# Patient Record
Sex: Male | Born: 2005 | Race: Black or African American | Hispanic: No | Marital: Single | Smoking: Never smoker
Health system: Southern US, Community
[De-identification: ages and names within clinical notes are randomized; demographics above are authoritative.]

## PROBLEM LIST (undated history)

## (undated) DIAGNOSIS — T7840XA Allergy, unspecified, initial encounter: Secondary | ICD-10-CM

## (undated) DIAGNOSIS — Z8781 Personal history of (healed) traumatic fracture: Secondary | ICD-10-CM

## (undated) DIAGNOSIS — J45909 Unspecified asthma, uncomplicated: Secondary | ICD-10-CM

## (undated) HISTORY — DX: Personal history of (healed) traumatic fracture: Z87.81

## (undated) HISTORY — DX: Allergy, unspecified, initial encounter: T78.40XA

## (undated) HISTORY — DX: Unspecified asthma, uncomplicated: J45.909

## (undated) HISTORY — PX: ORBITAL FRACTURE SURGERY: SHX725

---
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
## 2023-06-12 ENCOUNTER — Encounter (HOSPITAL_COMMUNITY): Payer: Self-pay

## 2023-06-12 ENCOUNTER — Ambulatory Visit (INDEPENDENT_AMBULATORY_CARE_PROVIDER_SITE_OTHER)

## 2023-06-12 ENCOUNTER — Ambulatory Visit (HOSPITAL_COMMUNITY): Admission: EM | Admit: 2023-06-12 | Discharge: 2023-06-12 | Disposition: A | Discharge status: Home / Self Care

## 2023-06-12 DIAGNOSIS — J45901 Unspecified asthma with (acute) exacerbation: Secondary | ICD-10-CM | POA: Diagnosis not present

## 2023-06-12 DIAGNOSIS — J45909 Unspecified asthma, uncomplicated: Secondary | ICD-10-CM | POA: Diagnosis not present

## 2023-06-12 DIAGNOSIS — R0602 Shortness of breath: Secondary | ICD-10-CM | POA: Diagnosis not present

## 2023-06-12 MED ORDER — GUAIFENESIN ER 600 MG PO TB12: 600.0000 mg | ORAL_TABLET | Freq: Two times a day (BID) | ORAL | 0 refills | Status: AC

## 2023-06-12 MED ORDER — PREDNISONE 20 MG PO TABS: 40.0000 mg | ORAL_TABLET | Freq: Every day | ORAL | 0 refills | Status: AC

## 2023-06-12 MED ORDER — ALBUTEROL SULFATE HFA 108 (90 BASE) MCG/ACT IN AERS: 1.0000 | INHALATION_SPRAY | Freq: Four times a day (QID) | RESPIRATORY_TRACT | 0 refills | Status: DC | PRN

## 2023-06-12 MED ORDER — IPRATROPIUM-ALBUTEROL 0.5-2.5 (3) MG/3ML IN SOLN
3.0000 mL | Freq: Once | RESPIRATORY_TRACT | Status: AC
  Administered 2023-06-12: 3 mL via RESPIRATORY_TRACT

## 2023-06-12 MED ORDER — IPRATROPIUM-ALBUTEROL 0.5-2.5 (3) MG/3ML IN SOLN
RESPIRATORY_TRACT | Status: AC
  Filled 2023-06-12: qty 3

## 2023-06-12 NOTE — ED Provider Notes (Signed)
 [facility]    CSN: [id number] Arrival date & time: 06/12/23  4098      History   Chief Complaint Chief Complaint  Patient presents with   Asthma    HPI TIJUAN Chang is a 18 y.o. male.   Patient presents to clinic over concerns of shortness of breath and wheezing.  Noticed symptoms started about a week ago.  Does have a history of asthma, has not had any recent exacerbations.  Has been using albuterol  inhaler multiple times throughout the day with temporary relief.  Shortness of breath while resting that worsens with activity.  Woke from sleep around 3 times last night due to shortness of breath.  Last used albuterol  inhaler earlier this morning.  Has felt fatigued.  Denies fevers.  Endorses nonproductive cough, feels like there is mucus stuck in his chest.  Denies recent sick contacts.  Without vomiting or diarrhea.  The history is provided by the patient and medical records.  Asthma    Past Medical History:  Diagnosis Date   Allergy    Asthma    History of reduction of orbital fracture    History of wrist fracture     Patient Active Problem List   Diagnosis Date Noted   Bloating 03/07/2022   Mild intermittent asthma without complication 09/12/2021    Past Surgical History:  Procedure Laterality Date   ORBITAL FRACTURE SURGERY Left        Home Medications    Prior to Admission medications   Medication Sig Start Date End Date Taking? Authorizing Provider  albuterol  (VENTOLIN  HFA) 108 (90 Base) MCG/ACT inhaler Inhale 1-2 puffs into the lungs every 6 (six) hours as needed for wheezing or shortness of breath. 06/12/23  Yes Harlow Lighter, Nicholette Dolson  N, FNP  fexofenadine  (ALLEGRA ) 180 MG tablet Take 1 tablet (180 mg total) by mouth daily. 06/11/22  Yes McElwee, Lauren A, NP  Fluticasone Propionate, Inhal, (FLOVENT IN) Inhale into the lungs.   Yes [provider]  guaiFENesin (MUCINEX) 600 MG 12 hr tablet Take 1 tablet (600 mg total) by mouth 2  (two) times daily for 5 days. 06/12/23 06/17/23 Yes Yanni Quiroa  N, FNP  predniSONE  (DELTASONE ) 20 MG tablet Take 2 tablets (40 mg total) by mouth daily for 5 days. 06/12/23 06/17/23 Yes Harlow Lighter, Sebastain Fishbaugh  N, FNP    Family History Family History  Problem Relation Age of Onset   Healthy Mother    Healthy Father     Social History Social History   Tobacco Use   Smoking status: Never   Smokeless tobacco: Never  Vaping Use   Vaping status: Never Used  Substance Use Topics   Alcohol use: Yes    Comment: rarely   Drug use: Yes    Types: Marijuana    Comment: occasionally     Allergies   Peanut-containing drug products, Tree extract, and Other   Review of Systems Review of Systems  Per HPI  Physical Exam Triage Vital Signs ED Triage Vitals  Encounter Vitals Group     BP 06/12/23 1057 123/69     Systolic BP Percentile --      Diastolic BP Percentile --      Pulse Rate 06/12/23 1057 68     Resp 06/12/23 1057 18     Temp 06/12/23 1057 97.6 F (36.4 C)     Temp Source 06/12/23 1057 Oral     SpO2 06/12/23 1057 95 %     Weight --  Height --      Head Circumference --      Peak Flow --      Pain Score 06/12/23 1056 0     Pain Loc --      Pain Education --      Exclude from Growth Chart --    No data found.  Updated Vital Signs BP 123/69 (BP Location: Left Arm)   Pulse 68   Temp 97.6 F (36.4 C) (Oral)   Resp 18   SpO2 95%   Visual Acuity Right Eye Distance:   Left Eye Distance:   Bilateral Distance:    Right Eye Near:   Left Eye Near:    Bilateral Near:     Physical Exam Vitals and nursing note reviewed.  Constitutional:      Appearance: Normal appearance.  HENT:     Head: Normocephalic and atraumatic.     Right Ear: External ear normal.     Left Ear: External ear normal.     Nose: Nose normal.     Mouth/Throat:     Mouth: Mucous membranes are moist.  Eyes:     Conjunctiva/sclera: Conjunctivae normal.  Cardiovascular:     Rate and  Rhythm: Normal rate and regular rhythm.     Heart sounds: Normal heart sounds. No murmur heard. Pulmonary:     Effort: Pulmonary effort is normal.     Breath sounds: Wheezing and rhonchi present.  Skin:    General: Skin is warm and dry.  Neurological:     General: No focal deficit present.     Mental Status: He is alert.  Psychiatric:        Mood and Affect: Mood normal.      UC Treatments / Results  Labs (all labs ordered are listed, but only abnormal results are displayed) Labs Reviewed - No data to display  EKG   Radiology No results found.  Procedures Procedures (including critical care time)  Medications Ordered in UC Medications  ipratropium-albuterol  (DUONEB) 0.5-2.5 (3) MG/3ML nebulizer solution 3 mL (3 mLs Nebulization Given 06/12/23 1125)    Initial Impression / Assessment and Plan / UC Course  I have reviewed the triage vital signs and the nursing notes.  Pertinent labs & imaging results that were available during my care of the patient were reviewed by me and considered in my medical decision making (see chart for details).  Vitals in triage reviewed, patient is hemodynamically stable.  Heart with regular rate and rhythm, lungs with diffuse inspiratory and expiratory wheezing and rhonchi.  DuoNeb given in clinic.  Patient with overall better air movement after DuoNeb.  Chest x-ray by my interpretation does not show any acute infiltrate.  Will treat for asthma exacerbation with prednisone  burst.  Staff to schedule with PCP for further management of asthma.  Plan of care, follow-up care return precautions given, no questions at this time.  Work note provided.     Final Clinical Impressions(s) / UC Diagnoses   Final diagnoses:  Asthma with acute exacerbation, unspecified asthma severity, unspecified whether persistent     Discharge Instructions      Take the steroids today and then daily with breakfast.  Continue to use your albuterol  inhaler every 6  hours as needed for any wheezing or shortness of breath.  Mucinex will help loosen your secretions, ensure you are drinking at least 64 ounces of water daily.  Follow-up with your primary care provider regarding further management of your chronic disease, asthma.  Primary care providers are able to better manage chronic conditions such as asthma and can work with you regarding medication management.  Return to clinic for any new or urgent symptoms.   ED Prescriptions     Medication Sig Dispense Auth. Provider   predniSONE  (DELTASONE ) 20 MG tablet Take 2 tablets (40 mg total) by mouth daily for 5 days. 10 tablet Harlow Lighter, Kimetha Trulson  N, FNP   albuterol  (VENTOLIN  HFA) 108 (90 Base) MCG/ACT inhaler Inhale 1-2 puffs into the lungs every 6 (six) hours as needed for wheezing or shortness of breath. 18 g Harlow Lighter, Gracey Tolle  N, FNP   guaiFENesin (MUCINEX) 600 MG 12 hr tablet Take 1 tablet (600 mg total) by mouth 2 (two) times daily for 5 days. 10 tablet Harlow Lighter, Shoua Ulloa  N, FNP      PDMP not reviewed this encounter.   Chip Coulter, FNP 06/12/23 1149

## 2023-06-12 NOTE — Discharge Instructions (Signed)
 Take the steroids today and then daily with breakfast.  Continue to use your albuterol  inhaler every 6 hours as needed for any wheezing or shortness of breath.  Mucinex will help loosen your secretions, ensure you are drinking at least 64 ounces of water daily.  Follow-up with your primary care provider regarding further management of your chronic disease, asthma.  Primary care providers are able to better manage chronic conditions such as asthma and can work with you regarding medication management.  Return to clinic for any new or urgent symptoms.

## 2023-06-12 NOTE — ED Triage Notes (Signed)
 Patient presenting with SOB wheezing onset 1 week ago. Patient has asthma and having to use his inhaler more often. SOB with any exertion.  No known sick exposure.  Prescriptions or OTC medications tried: Yes- albuterol  inhaler   with mild relief.

## 2023-08-31 ENCOUNTER — Encounter: Payer: Self-pay | Admitting: Nurse Practitioner

## 2023-08-31 ENCOUNTER — Ambulatory Visit: Admitting: Nurse Practitioner

## 2023-08-31 VITALS — BP 106/59 | HR 69 | Temp 97.5°F | Wt 173.0 lb

## 2023-08-31 DIAGNOSIS — Z113 Encounter for screening for infections with a predominantly sexual mode of transmission: Secondary | ICD-10-CM | POA: Insufficient documentation

## 2023-08-31 DIAGNOSIS — Z1321 Encounter for screening for nutritional disorder: Secondary | ICD-10-CM

## 2023-08-31 DIAGNOSIS — Z0000 Encounter for general adult medical examination without abnormal findings: Secondary | ICD-10-CM | POA: Insufficient documentation

## 2023-08-31 DIAGNOSIS — J302 Other seasonal allergic rhinitis: Secondary | ICD-10-CM

## 2023-08-31 DIAGNOSIS — J309 Allergic rhinitis, unspecified: Secondary | ICD-10-CM | POA: Insufficient documentation

## 2023-08-31 DIAGNOSIS — Z1329 Encounter for screening for other suspected endocrine disorder: Secondary | ICD-10-CM

## 2023-08-31 DIAGNOSIS — F32A Depression, unspecified: Secondary | ICD-10-CM | POA: Diagnosis not present

## 2023-08-31 DIAGNOSIS — J4520 Mild intermittent asthma, uncomplicated: Secondary | ICD-10-CM

## 2023-08-31 DIAGNOSIS — Z13228 Encounter for screening for other metabolic disorders: Secondary | ICD-10-CM

## 2023-08-31 DIAGNOSIS — Z130 Encounter for screening for diseases of the blood and blood-forming organs and certain disorders involving the immune mechanism: Secondary | ICD-10-CM

## 2023-08-31 DIAGNOSIS — F419 Anxiety disorder, unspecified: Secondary | ICD-10-CM

## 2023-08-31 MED ORDER — ALBUTEROL SULFATE HFA 108 (90 BASE) MCG/ACT IN AERS: 1.0000 | INHALATION_SPRAY | Freq: Four times a day (QID) | RESPIRATORY_TRACT | 3 refills | Status: AC | PRN

## 2023-08-31 MED ORDER — FLUTICASONE PROPIONATE 50 MCG/ACT NA SUSP: 2.0000 | Freq: Every day | NASAL | 6 refills | Status: AC

## 2023-08-31 MED ORDER — FEXOFENADINE HCL 180 MG PO TABS: 180.0000 mg | ORAL_TABLET | Freq: Every day | ORAL | 3 refills | Status: AC

## 2023-08-31 NOTE — Patient Instructions (Signed)

## 2023-08-31 NOTE — Assessment & Plan Note (Addendum)
 Annual exam as documented.  Counseling done include healthy lifestyle involving committing to 150 minutes of exercise per week, heart healthy diet,  The importance of adequate sleep also discussed.  Regular use of seat bel also discussed . Changes in health habits are decided on by patient with goals and time frames set for achieving them. Immunization and cancer screening  needs are specifically addressed at this visit.  Routine fasting labs ordered Stated that he is up-to-date with his childhood vaccines

## 2023-08-31 NOTE — Assessment & Plan Note (Signed)
 Allegra  180 mg daily, Flonase  nasal spray 2 spray into both nostrils daily refilled

## 2023-08-31 NOTE — Assessment & Plan Note (Signed)
 Flowsheet Row Office Visit from 08/31/2023 in [facility] - A Dept Of [facility]  PHQ-9 Total Score 2       08/31/2023    9:48 AM 09/12/2021    4:12 PM  GAD 7 : Generalized Anxiety Score  Nervous, Anxious, on Edge 0 0  Control/stop worrying 3 0  Worry too much - different things 0 0  Trouble relaxing 0 0  Restless 0 0  Easily annoyed or irritable 0 0  Afraid - awful might happen 0 0  Total GAD 7 Score 3 0  Anxiety Difficulty Not difficult at all Not difficult at all  Patient declined Encouraged to call the office if his symptoms becomes worse Written  materials for anxiety and depression provided

## 2023-08-31 NOTE — Progress Notes (Signed)
 Complete physical exam  Patient: Blake Chang   DOB: January 26, 2005   18 y.o. Male  MRN: [id number]  Subjective:    Chief Complaint  Patient presents with   Establish Care      Discussed the use of AI scribe software for clinical note transcription with the patient, who gave verbal consent to proceed.  History of Present Illness Blake Chang is a 18 year old  has a past medical history of Allergy, Asthma, History of reduction of orbital fracture, and History of wrist fracture. male who presents for a new provider visit and annual physical exam.  He is establishing care with a new provider due to the inconvenience of his previous location in [place]. He seeks an annual physical examination.  He has a history of asthma, managed with an albuterol  inhaler as needed, primarily during certain seasons or when ill. He does not require daily use but experiences exacerbations necessitating increased use.  He has seasonal allergies, managed with Allegra  and a Flonase  (fluticasone ) inhaler. His allergies are not year-round.  His past surgical history includes an orbital fracture surgery. He denies any other medical conditions or surgeries.  He is sexually active with one partner, uses marijuana daily, and drinks alcohol socially. He does not smoke or vape. 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 He maintains an active lifestyle, engaging in running, roller skating, basketball, and regular workouts.  Family history is non-contributory with no known conditions such as cancer in his immediate family.  No vision problems. He feels generally well compared to peers and ensures adequate rest and sleep, averaging seven to eight hours nightly.   Screening was positive for anxiety and depression he attributes this to life in general, this has been ongoing for the past 6 weeks.  He denies SI, HI.  Not interested in treatment     Most  recent fall risk assessment:    09/08/2022   11:17 AM  Fall Risk   Falls in the past year? 0  Number falls in past yr: 0  Injury with Fall? 0  Risk for fall due to : No Fall Risks  Follow up Falls evaluation completed     Most recent depression screenings:    08/31/2023    9:46 AM 03/07/2022    9:10 AM  PHQ 2/9 Scores  PHQ - 2 Score 2 0  PHQ- 9 Score 2         Patient Care Team: Kenzleigh Sedam R, FNP as PCP - General (Nurse Practitioner)   Outpatient Medications Prior to Visit  Medication Sig   [DISCONTINUED] albuterol  (VENTOLIN  HFA) 108 (90 Base) MCG/ACT inhaler Inhale 1-2 puffs into the lungs every 6 (six) hours as needed for wheezing or shortness of breath.   [DISCONTINUED] fexofenadine  (ALLEGRA ) 180 MG tablet Take 1 tablet (180 mg total) by mouth daily.   [DISCONTINUED] Fluticasone  Propionate, Inhal, (FLOVENT  IN) Inhale into the lungs.   No facility-administered medications prior to visit.    Review of Systems  Constitutional:  Negative for appetite change, chills, fatigue and fever.  HENT:  Negative for congestion, postnasal drip, rhinorrhea and sneezing.   Eyes:  Negative for pain, discharge and itching.  Respiratory:  Negative for cough, shortness of breath and wheezing.   Cardiovascular:  Negative for chest pain, palpitations and leg swelling.  Gastrointestinal:  Negative for abdominal pain, constipation, nausea and vomiting.  Endocrine: Negative for cold intolerance, heat intolerance and polydipsia.  Genitourinary:  Negative for difficulty urinating, dysuria, flank pain and frequency.  Musculoskeletal:  Negative for arthralgias, back pain, joint swelling and myalgias.  Skin:  Negative for color change, pallor, rash and wound.  Neurological:  Negative for dizziness, facial asymmetry, weakness, numbness and headaches.  Psychiatric/Behavioral:  Negative for behavioral problems, confusion, self-injury and suicidal ideas.        Objective:     BP (!)  106/59   Pulse 69   Temp (!) 97.5 F (36.4 C)   Wt 173 lb (78.5 kg)   SpO2 99%   BMI 25.09 kg/m    Physical Exam Vitals and nursing note reviewed.  Constitutional:      General: He is not in acute distress.    Appearance: Normal appearance. He is not ill-appearing, toxic-appearing or diaphoretic.  HENT:     Right Ear: Tympanic membrane, ear canal and external ear normal. There is no impacted cerumen.     Left Ear: Tympanic membrane, ear canal and external ear normal. There is no impacted cerumen.     Nose: Nose normal. No congestion or rhinorrhea.     Mouth/Throat:     Mouth: Mucous membranes are moist.     Pharynx: Oropharynx is clear. No oropharyngeal exudate or posterior oropharyngeal erythema.  Eyes:     General: No scleral icterus.       Right eye: No discharge.        Left eye: No discharge.     Extraocular Movements: Extraocular movements intact.     Conjunctiva/sclera: Conjunctivae normal.  Neck:     Vascular: No carotid bruit.  Cardiovascular:     Rate and Rhythm: Normal rate and regular rhythm.     Pulses: Normal pulses.     Heart sounds: Normal heart sounds. No murmur heard.    No friction rub. No gallop.  Pulmonary:     Effort: Pulmonary effort is normal. No respiratory distress.     Breath sounds: Normal breath sounds. No stridor. No wheezing, rhonchi or rales.  Chest:     Chest wall: No tenderness.  Abdominal:     General: Bowel sounds are normal. There is no distension.     Palpations: Abdomen is soft. There is no mass.     Tenderness: There is no abdominal tenderness. There is no right CVA tenderness, left CVA tenderness, guarding or rebound.     Hernia: No hernia is present.  Musculoskeletal:        General: No swelling, tenderness, deformity or signs of injury.     Cervical back: Normal range of motion and neck supple. No rigidity or tenderness.     Right lower leg: No edema.     Left lower leg: No edema.  Lymphadenopathy:     Cervical: No cervical  adenopathy.  Skin:    General: Skin is warm and dry.     Capillary Refill: Capillary refill takes less than 2 seconds.     Coloration: Skin is not jaundiced or pale.     Findings: No bruising, erythema, lesion or rash.  Neurological:     Mental Status: He is alert and oriented to person, place, and time.     Cranial Nerves: No cranial nerve deficit.     Motor: No weakness.     Gait: Gait normal.  Psychiatric:        Mood and Affect: Mood normal.        Behavior: Behavior normal.        Thought Content:  Thought content normal.        Judgment: Judgment normal.     No results found for any visits on 08/31/23.     Assessment & Plan:    Routine Health Maintenance and Physical Exam  Immunization History  Administered Date(s) Administered   DTaP 06/13/2003, 08/22/2003, 10/18/2003, 12/16/2004, 08/02/2007   HIB (PRP-OMP) 06/13/2003, 08/22/2003, 10/18/2003, 06/03/2004   HPV 9-valent 11/25/2018, 02/03/2019, 05/23/2020   Hepatitis A 04/16/2005, 01/28/2006   Hepatitis B 2005-05[birth year], 05/18/2003, 10/18/2003   IPV 06/13/2003, 08/22/2003, 01/28/2004, 08/02/2007   MMR 06/03/2004, 08/02/2007   MenQuadfi_Meningococcal Groups ACYW Conjugate 11/20/2015, 09/14/2020   PPD Test 06/05/2021   Pneumococcal Conjugate-13 06/13/2003, 08/22/2003, 10/18/2003, 06/03/2004   Td 11/20/2015   Tdap 11/20/2015   Varicella 06/03/2004, 08/02/2007    Health Maintenance  Topic Date Due   Pneumococcal Vaccine: 19-49 Years (1 of 1 - PPSV23, PCV20, or PCV21) 04/11/2009   Meningococcal B Vaccine (1 of 2 - Standard) Never done   COVID-19 Vaccine (1 - 2024-25 season) Never done   INFLUENZA VACCINE  08/21/2023   DTaP/Tdap/Td (8 - Td or Tdap) 11/19/2025   Hepatitis B Vaccines  Completed   HPV VACCINES  Completed   Hepatitis C Screening  Completed   HIV Screening  Completed    Discussed health benefits of physical activity, and encouraged him to engage in regular exercise appropriate for his age and  condition.  Problem List Items Addressed This Visit       Respiratory   Mild intermittent asthma without complication   Assessment and Plan Assessment & Plan  Continue albuterol  inhaler as needed Medication refilled      Relevant Medications   albuterol  (VENTOLIN  HFA) 108 (90 Base) MCG/ACT inhaler   Allergic rhinitis   Allegra  180 mg daily, Flonase  nasal spray 2 spray into both nostrils daily refilled       Relevant Medications   fexofenadine  (ALLEGRA ) 180 MG tablet   fluticasone  (FLONASE ) 50 MCG/ACT nasal spray     Other   Annual physical exam - Primary   Annual exam as documented.  Counseling done include healthy lifestyle involving committing to 150 minutes of exercise per week, heart healthy diet,  The importance of adequate sleep also discussed.  Regular use of seat bel also discussed . Changes in health habits are decided on by patient with goals and time frames set for achieving them. Immunization and cancer screening  needs are specifically addressed at this visit.  Routine fasting labs ordered Stated that he is up-to-date with his childhood vaccines      Screen for STD (sexually transmitted disease)   Need to maintain 1 sexual partner and use condom to reduce risk of STDs discussed      Relevant Orders   RPR   HepB+HepC+HIV Panel   Chlamydia/Gonococcus/Trichomonas, NAA   Anxiety and depression   Flowsheet Row Office Visit from 08/31/2023 in [facility] - [facility] Dept Of [facility]  PHQ-9 Total Score 2       08/31/2023    9:48 AM 09/12/2021    4:12 PM  GAD 7 : Generalized Anxiety Score  Nervous, Anxious, on Edge 0 0  Control/stop worrying 3 0  Worry too much - different things 0 0  Trouble relaxing 0 0  Restless 0 0  Easily annoyed or irritable 0 0  Afraid - awful might happen 0 0  Total GAD 7 Score 3 0  Anxiety Difficulty Not difficult at all Not difficult  at all  Patient declined Encouraged to call the office if his  symptoms becomes worse Written  materials for anxiety and depression provided        Other Visit Diagnoses       Screening for endocrine, nutritional, metabolic and immunity disorder       Relevant Orders   Lipid panel   CBC   CMP14+EGFR      Return in about 1 year (around 08/30/2024) for FASTING LABS THIS WEEK.     Angeleigh Chiasson R Verleen Stuckey, FNP

## 2023-08-31 NOTE — Assessment & Plan Note (Signed)
 Need to maintain 1 sexual partner and use condom to reduce risk of STDs discussed

## 2023-08-31 NOTE — Assessment & Plan Note (Signed)
 Assessment and Plan Assessment & Plan  Continue albuterol  inhaler as needed Medication refilled

## 2023-09-01 ENCOUNTER — Telehealth: Payer: Self-pay

## 2023-09-01 ENCOUNTER — Other Ambulatory Visit: Payer: Self-pay

## 2023-09-01 NOTE — Telephone Encounter (Signed)
 Pharmacy Patient Advocate Encounter  Received notification from [facility] that Prior Authorization for FEXOFENADINE  has been APPROVED from 09/01/2023 to 08/31/2024   PA #/Case ID/Reference #: [id number]

## 2023-09-03 ENCOUNTER — Other Ambulatory Visit: Payer: Self-pay

## 2023-09-07 ENCOUNTER — Telehealth: Payer: Self-pay | Admitting: Nurse Practitioner

## 2023-09-08 ENCOUNTER — Other Ambulatory Visit: Payer: Self-pay

## 2023-09-14 ENCOUNTER — Other Ambulatory Visit: Payer: Self-pay

## 2023-09-14 DIAGNOSIS — Z1321 Encounter for screening for nutritional disorder: Secondary | ICD-10-CM

## 2023-09-14 DIAGNOSIS — Z13228 Encounter for screening for other metabolic disorders: Secondary | ICD-10-CM | POA: Diagnosis not present

## 2023-09-14 DIAGNOSIS — Z113 Encounter for screening for infections with a predominantly sexual mode of transmission: Secondary | ICD-10-CM | POA: Diagnosis not present

## 2023-09-14 DIAGNOSIS — Z1329 Encounter for screening for other suspected endocrine disorder: Secondary | ICD-10-CM | POA: Diagnosis not present

## 2023-09-14 DIAGNOSIS — Z130 Encounter for screening for diseases of the blood and blood-forming organs and certain disorders involving the immune mechanism: Secondary | ICD-10-CM | POA: Diagnosis not present

## 2023-09-15 ENCOUNTER — Ambulatory Visit: Payer: Self-pay | Admitting: Nurse Practitioner

## 2023-09-15 LAB — CMP14+EGFR
ALT: 11 IU/L (ref 0–44)
AST: 15 IU/L (ref 0–40)
Albumin: 4.6 g/dL (ref 4.3–5.2)
Alkaline Phosphatase: 64 IU/L (ref 51–125)
BUN/Creatinine Ratio: 20 (ref 9–20)
BUN: 24 mg/dL — ABNORMAL HIGH (ref 6–20)
Bilirubin Total: 0.6 mg/dL (ref 0.0–1.2)
CO2: 23 mmol/L (ref 20–29)
Calcium: 9.4 mg/dL (ref 8.7–10.2)
Chloride: 104 mmol/L (ref 96–106)
Creatinine, Ser: 1.23 mg/dL (ref 0.76–1.27)
Globulin, Total: 2.3 g/dL (ref 1.5–4.5)
Glucose: 84 mg/dL (ref 70–99)
Potassium: 4.2 mmol/L (ref 3.5–5.2)
Sodium: 141 mmol/L (ref 134–144)
Total Protein: 6.9 g/dL (ref 6.0–8.5)
eGFR: 86 mL/min/1.73 (ref >59)

## 2023-09-15 LAB — LIPID PANEL
Chol/HDL Ratio: 2 ratio (ref 0.0–5.0)
Cholesterol, Total: 146 mg/dL (ref 100–199)
HDL: 74 mg/dL (ref >39)
LDL Chol Calc (NIH): 61 mg/dL (ref 0–99)
Triglycerides: 47 mg/dL (ref 0–149)
VLDL Cholesterol Cal: 11 mg/dL (ref 5–40)

## 2023-09-15 LAB — CBC
Hematocrit: 45.8 % (ref 37.5–51.0)
Hemoglobin: 14.7 g/dL (ref 13.0–17.7)
MCH: 27.3 pg (ref 26.6–33.0)
MCHC: 32.1 g/dL (ref 31.5–35.7)
MCV: 85 fL (ref 79–97)
Platelets: 177 x10E3/uL (ref 150–450)
RBC: 5.38 x10E6/uL (ref 4.14–5.80)
RDW: 13.5 % (ref 11.6–15.4)
WBC: 4.9 x10E3/uL (ref 3.4–10.8)

## 2023-09-15 LAB — HEPB+HEPC+HIV PANEL
HIV Screen 4th Generation wRfx: NONREACTIVE
Hep B C IgM: NEGATIVE
Hep B Core Total Ab: NEGATIVE
Hep B E Ab: NONREACTIVE
Hep B E Ag: NEGATIVE
Hep B Surface Ab, Qual: NONREACTIVE
Hep C Virus Ab: NONREACTIVE
Hepatitis B Surface Ag: NEGATIVE

## 2023-09-15 LAB — RPR: RPR Ser Ql: NONREACTIVE

## 2023-09-16 LAB — CHLAMYDIA/GONOCOCCUS/TRICHOMONAS, NAA
Chlamydia by NAA: NEGATIVE
Gonococcus by NAA: NEGATIVE
Trich vag by NAA: NEGATIVE

## 2023-10-12 ENCOUNTER — Ambulatory Visit: Payer: Self-pay

## 2023-10-12 NOTE — Telephone Encounter (Signed)
 FYI Only or Action Required?: FYI only for provider.  Patient was last seen in primary care on 08/31/2023 by Paseda, Folashade R, FNP.  Called Nurse Triage reporting Rash.  Symptoms began several days ago.  Interventions attempted: Nothing.  Symptoms are: gradually worsening.  Triage Disposition: See Physician Within 24 Hours  Patient/caregiver understands and will follow disposition?: Yes   Copied from CRM #[id number]. Topic: Clinical - Red Word Triage >> Oct 12, 2023  3:13 PM Chiquita SQUIBB wrote: Red Word that prompted transfer to Nurse Triage: Patient is calling in stating that anytime he is around heat or gets hot he develops a rash, with pain all over his body. Patient did say it swells a little. Patient states it does not go away.    ----------------------------------------------------------------------- From previous Reason for Contact - Scheduling: Patient/patient representative is calling to schedule an appointment. Refer to attachments for appointment information. Reason for Disposition  SEVERE itching (i.e., interferes with sleep, normal activities or school)  Answer Assessment - Initial Assessment Questions 1. APPEARANCE of RASH: What does the rash look like? (e.g., blisters, dry flaky skin, red spots, redness, sores)     Bumpy, Reddened  2. SIZE: How big are the spots? (e.g., tip of pen, eraser, coin; inches, centimeters)     Widespread  3. LOCATION: Where is the rash located?     Inside of Elbow, Bilateral, Chest, Back. Hips, Neck, Abdomen  4. COLOR: What color is the rash? (Note: It is difficult to assess rash color in people with darker-colored skin. When this situation occurs, simply ask the caller to describe what they see.)     Small Reddened Bumps  5. ONSET: When did the rash begin?     3rd Day  6. FEVER: Do you have a fever? If Yes, ask: What is your temperature, how was it measured, and when did it start?     No  7. ITCHING: Does the rash  itch? If Yes, ask: How bad is the itch? (Scale 1-10; or mild, moderate, severe)     Intermittent, Exacerbated by Heat, Moderate  8. CAUSE: What do you think is causing the rash?     Unsure  9. MEDICINE FACTORS: Have you started any new medicines within the last 2 weeks? (e.g., antibiotics)      No new meds  10. OTHER SYMPTOMS: Do you have any other symptoms? (e.g., dizziness, headache, sore throat, joint pain)       Stinging, Denies any other symptoms  Protocols used: Rash or Redness - [facility]

## 2023-11-09 NOTE — Telephone Encounter (Signed)
 Error

## 2023-11-26 ENCOUNTER — Ambulatory Visit: Payer: Self-pay

## 2023-11-26 NOTE — Telephone Encounter (Signed)
 FYI Only or Action Required?: FYI only for provider: appointment scheduled on 11.7.25.  Patient was last seen in primary care on 08/31/2023 by Paseda, Folashade R, FNP.  Called Nurse Triage reporting URI.  Symptoms began several days ago.  Interventions attempted: Prescription medications: inhaler.  Symptoms are: gradually worsening.  Triage Disposition: See HCP Within 4 Hours (Or PCP Triage)  Patient/caregiver understands and will follow disposition?: Yes    Copied from CRM #[id number]. Topic: Clinical - Red Word Triage >> Nov 26, 2023 11:48 AM Avram MATSU wrote: Red Word that prompted transfer to Nurse Triage: body aches, fever, headaches, lung congestions.    ----------------------------------------------------------------------- From previous Reason for Contact - Scheduling: Patient/patient representative is calling to schedule an appointment. Refer to attachments for appointment information. Reason for Disposition  Wheezing is present  Answer Assessment - Initial Assessment Questions Pt states multiple symptoms started on Sunday, sore throat, headache. Then started to have cough, that he feels like has not settled into his chest. He has had to use his inhaler more. RN did offer appt today morning or UC. Pt wanted appt tomorrow morning     1. ONSET: When did the cough begin?      Sunday night 2. SEVERITY: How bad is the cough today?      moderate 3. SPUTUM: Describe the color of your sputum (e.g., none, dry cough; clear, white, yellow, green)     Thick dark mucus 4. HEMOPTYSIS: Are you coughing up any blood? If Yes, ask: How much? (e.g., flecks, streaks, tablespoons, etc.)     no 5. DIFFICULTY BREATHING: Are you having difficulty breathing? If Yes, ask: How bad is it? (e.g., mild, moderate, severe)      Yes, been using inhaler today- 7 at night gets worse 6. FEVER: Do you have a fever? If Yes, ask: What is your temperature, how was it measured, and when did  it start?     Yes- felt hot 7. CARDIAC HISTORY: Do you have any history of heart disease? (e.g., heart attack, congestive heart failure)      no 8. LUNG HISTORY: Do you have any history of lung disease?  (e.g., pulmonary embolus, asthma, emphysema)     asthma 9. PE RISK FACTORS: Do you have a history of blood clots? (or: recent major surgery, recent prolonged travel, bedridden)     no 10. OTHER SYMPTOMS: Do you have any other symptoms? (e.g., runny nose, wheezing, chest pain)       Runny nose, sore throat, wheezing, body aches, headache  Protocols used: Cough - Acute Productive-A-AH

## 2023-11-27 ENCOUNTER — Encounter: Payer: Self-pay | Admitting: Nurse Practitioner

## 2023-11-27 ENCOUNTER — Ambulatory Visit: Payer: Self-pay | Admitting: Nurse Practitioner

## 2023-11-27 VITALS — BP 139/86 | HR 62 | Ht 70.0 in | Wt 176.0 lb

## 2023-11-27 DIAGNOSIS — J069 Acute upper respiratory infection, unspecified: Secondary | ICD-10-CM | POA: Diagnosis not present

## 2023-11-27 DIAGNOSIS — R6889 Other general symptoms and signs: Secondary | ICD-10-CM | POA: Diagnosis not present

## 2023-11-27 DIAGNOSIS — J4520 Mild intermittent asthma, uncomplicated: Secondary | ICD-10-CM | POA: Diagnosis not present

## 2023-11-27 DIAGNOSIS — L509 Urticaria, unspecified: Secondary | ICD-10-CM

## 2023-11-27 MED ORDER — PREDNISONE 20 MG PO TABS
20.0000 mg | ORAL_TABLET | Freq: Every day | ORAL | 0 refills | Status: AC
Start: 2023-11-27 — End: ?

## 2023-11-27 MED ORDER — AMOXICILLIN 875 MG PO TABS: 875.0000 mg | ORAL_TABLET | Freq: Two times a day (BID) | ORAL | 0 refills | Status: AC

## 2023-11-27 NOTE — Telephone Encounter (Signed)
 noted

## 2023-11-27 NOTE — Progress Notes (Unsigned)
 Subjective   Patient ID: Blake Chang, male    DOB: 12-21-2005, 18 y.o.   MRN: [id number]  Chief Complaint  Patient presents with   Cough    cough thick mucus--5 days. Tried benadryl, advil.    Referring provider: Paseda, Folashade R, FNP  Blake Chang is a 18 y.o. male with Past Medical History: No date: Allergy No date: Asthma No date: History of reduction of orbital fracture No date: History of wrist fracture   HPI  Patient presents for an acute visit. She complains of headache, fever, cough, congestion in head and chest, post nasal drip, and body aches. History of asthma. Also having breakouts and thinks it is an allergic reaction. Has not changed soaps or lotion. Denies f/c/s, n/v/d, hemoptysis, PND, leg swelling. Denies chest pain or edema.     Allergies  Allergen Reactions   Peanut-Containing Drug Products Anaphylaxis   Tree Extract Other (See Comments)    Other reaction(s): Other (See Comments)   Other     Tree nuts    Immunization History  Administered Date(s) Administered   DTaP 06/13/2003, 08/22/2003, 10/18/2003, 12/16/2004, 08/02/2007   HIB (PRP-OMP) 06/13/2003, 08/22/2003, 10/18/2003, 06/03/2004   HPV 9-valent 11/25/2018, 02/03/2019, 05/23/2020   Hepatitis A 04/16/2005, 01/28/2006   Hepatitis B 14-Oct-2003, 05/18/2003, 10/18/2003   IPV 06/13/2003, 08/22/2003, 01/28/2004, 08/02/2007   MMR 06/03/2004, 08/02/2007   MenQuadfi_Meningococcal Groups ACYW Conjugate 11/20/2015, 09/14/2020   PPD Test 06/05/2021   Pneumococcal Conjugate-13 06/13/2003, 08/22/2003, 10/18/2003, 06/03/2004   Td 11/20/2015   Tdap 11/20/2015   Varicella 06/03/2004, 08/02/2007    Tobacco History: Social History   Tobacco Use  Smoking Status Never  Smokeless Tobacco Never   Counseling given: Not Answered   Outpatient Encounter Medications as of 11/27/2023  Medication Sig   albuterol  (VENTOLIN  HFA) 108 (90 Base) MCG/ACT inhaler Inhale 1-2 puffs into the lungs  every 6 (six) hours as needed for wheezing or shortness of breath.   amoxicillin  (AMOXIL ) 875 MG tablet Take 1 tablet (875 mg total) by mouth 2 (two) times daily for 10 days.   fexofenadine  (ALLEGRA ) 180 MG tablet Take 1 tablet (180 mg total) by mouth daily.   fluticasone  (FLONASE ) 50 MCG/ACT nasal spray Place 2 sprays into both nostrils daily.   predniSONE  (DELTASONE ) 20 MG tablet Take 1 tablet (20 mg total) by mouth daily with breakfast.   No facility-administered encounter medications on file as of 11/27/2023.    Review of Systems  Review of Systems  Constitutional: Negative.   HENT: Negative.    Cardiovascular: Negative.   Gastrointestinal: Negative.   Allergic/Immunologic: Negative.   Neurological: Negative.   Psychiatric/Behavioral: Negative.       Objective:   BP 139/86   Pulse 62   Ht 5' 10 (1.778 m)   Wt 176 lb (79.8 kg)   SpO2 98%   BMI 25.25 kg/m   Wt Readings from Last 5 Encounters:  11/27/23 176 lb (79.8 kg)  08/31/23 173 lb (78.5 kg)  09/08/22 168 lb 6.4 oz (76.4 kg) (71%, Z= 0.54)*  06/11/22 169 lb (76.7 kg) (72%, Z= 0.59)*  03/07/22 171 lb 12.8 oz (77.9 kg) (76%, Z= 0.72)*   * Growth percentiles are based on CDC (Boys, 2-20 Years) data.     Physical Exam Vitals and nursing note reviewed.  Constitutional:      General: He is not in acute distress.    Appearance: He is well-developed.  Cardiovascular:     Rate and Rhythm: Normal rate  and regular rhythm.  Pulmonary:     Effort: Pulmonary effort is normal.     Breath sounds: Normal breath sounds.  Skin:    General: Skin is warm and dry.  Neurological:     Mental Status: He is alert and oriented to person, place, and time.     {Labs (Optional):23779}  Assessment & Plan:   Flu-like symptoms -     POC COVID-19 BinaxNow -     POCT Flu A & B Status  Mild intermittent asthma without complication -     predniSONE ; Take 1 tablet (20 mg total) by mouth daily with breakfast.  Dispense: 5 tablet;  Refill: 0 -     Ambulatory referral to Allergy  Upper respiratory tract infection, unspecified type -     Amoxicillin ; Take 1 tablet (875 mg total) by mouth 2 (two) times daily for 10 days.  Dispense: 20 tablet; Refill: 0  Urticaria -     Ambulatory referral to Allergy     Return in about 6 months (around 05/26/2024).     Bascom GORMAN Borer, NP 11/30/2023

## 2023-11-30 LAB — POCT FLU A/B STATUS
Influenza A, POC: NEGATIVE
Influenza B, POC: NEGATIVE

## 2023-11-30 LAB — POC COVID19 BINAXNOW: SARS Coronavirus 2 Ag: NEGATIVE

## 2024-01-04 ENCOUNTER — Encounter: Payer: Self-pay | Admitting: Nurse Practitioner

## 2024-01-04 ENCOUNTER — Ambulatory Visit: Admitting: Nurse Practitioner

## 2024-01-04 VITALS — Temp 98.4°F | Wt 172.0 lb

## 2024-01-04 DIAGNOSIS — J029 Acute pharyngitis, unspecified: Secondary | ICD-10-CM | POA: Diagnosis not present

## 2024-01-04 LAB — POCT RAPID STREP A (OFFICE): Rapid Strep A Screen: NEGATIVE

## 2024-01-04 MED ORDER — AMOXICILLIN 500 MG PO CAPS: 500.0000 mg | ORAL_CAPSULE | Freq: Two times a day (BID) | ORAL | 0 refills | Status: AC

## 2024-01-04 NOTE — Assessment & Plan Note (Signed)
°  Negative strep test, with symptoms and exam findings will treat with antibiotics - Prescribed amoxicillin  500 mg twice daily for 10 days she - Advised salt water gargles and warm water for pain relief. - Recommended Tylenol 250 mg every six hours for pain.

## 2024-01-04 NOTE — Patient Instructions (Addendum)
 [facility] [address] [place], [place] [address] [phone]    1. Acute pharyngitis, unspecified etiology (Primary) - POCT rapid strep A - amoxicillin  (AMOXIL ) 500 MG capsule; Take 1 capsule (500 mg total) by mouth 2 (two) times daily for 10 days.  Dispense: 20 capsule; Refill: 0    It is important that you exercise regularly at least 30 minutes 5 times a week as tolerated  Think about what you will eat, plan ahead. Choose  clean, green, fresh or frozen over canned, processed or packaged foods which are more sugary, salty and fatty. 70 to 75% of food eaten should be vegetables and fruit. Three meals at set times with snacks allowed between meals, but they must be fruit or vegetables. Aim to eat over a 12 hour period , example 7 am to 7 pm, and STOP after  your last meal of the day. Drink water,generally about 64 ounces per day, no other drink is as healthy. Fruit juice is best enjoyed in a healthy way, by EATING the fruit.  Thanks for choosing [facility] we consider it a privelige to serve you.

## 2024-01-04 NOTE — Progress Notes (Signed)
 Acute Office Visit  Subjective:     Patient ID: MUNG RINKER, male    DOB: [birth year], 18 y.o.   MRN: [id number]  Chief Complaint  Patient presents with   Sore Throat    Sore Throat  Pertinent negatives include no abdominal pain, congestion, coughing, headaches, shortness of breath or vomiting.    Discussed the use of AI scribe software for clinical note transcription with the patient, who gave verbal consent to proceed.  History of Present Illness Blake Chang is a 18 year old male who presents with a sore throat and difficulty swallowing.  He has experienced a sore throat with swelling and pain upon swallowing for the past three days. The sensation is described as 'swollen,' causing difficulty eating due to discomfort. No cough, fever, sneezing, runny nose, shortness of breath, or wheezing. He also reports body aches, joint pain, and a general feeling of weakness, along with ear pain.  A month ago, he was sick with flu-like symptoms, and all tests, including COVID and flu, were negative. Was prescribed prednisone  for suspected allergic reaction and he was referred to an allergist but has not yet followed up.  He is currently taking Allegra  180 mg daily and using Flonase  nasal spray. He completed a course of prednisone  prescribed during his last visit.     Assessment & Plan     Review of Systems  Constitutional:  Negative for appetite change, chills, fatigue and fever.  HENT:  Positive for sore throat. Negative for congestion, postnasal drip, rhinorrhea and sneezing.   Respiratory:  Negative for cough, shortness of breath and wheezing.   Cardiovascular:  Negative for chest pain, palpitations and leg swelling.  Gastrointestinal:  Negative for abdominal pain, constipation, nausea and vomiting.  Genitourinary:  Negative for difficulty urinating, dysuria, flank pain and frequency.  Musculoskeletal:  Negative for arthralgias, back pain, joint swelling and  myalgias.  Skin:  Negative for color change, pallor, rash and wound.  Neurological:  Negative for dizziness, facial asymmetry, weakness, numbness and headaches.  Psychiatric/Behavioral:  Negative for behavioral problems, confusion, self-injury and suicidal ideas.         Objective:    Temp 98.4 F (36.9 C)   Wt 172 lb (78 kg)   BMI 24.68 kg/m    Physical Exam Vitals and nursing note reviewed.  Constitutional:      General: He is not in acute distress.    Appearance: Normal appearance. He is not ill-appearing, toxic-appearing or diaphoretic.  HENT:     Right Ear: Tympanic membrane, ear canal and external ear normal. There is no impacted cerumen.     Left Ear: Tympanic membrane, ear canal and external ear normal.     Nose: Congestion present. No rhinorrhea.     Mouth/Throat:     Mouth: Mucous membranes are moist.     Pharynx: Oropharynx is clear. Posterior oropharyngeal erythema present. No oropharyngeal exudate.     Tonsils: Tonsillar exudate present. 1+ on the right. 1+ on the left.  Eyes:     General: No scleral icterus.       Right eye: No discharge.        Left eye: No discharge.     Extraocular Movements: Extraocular movements intact.     Conjunctiva/sclera: Conjunctivae normal.  Cardiovascular:     Rate and Rhythm: Normal rate and regular rhythm.     Pulses: Normal pulses.     Heart sounds: Normal heart sounds. No murmur heard.    No  friction rub. No gallop.  Pulmonary:     Effort: Pulmonary effort is normal. No respiratory distress.     Breath sounds: Normal breath sounds. No stridor. No wheezing, rhonchi or rales.  Chest:     Chest wall: No tenderness.  Abdominal:     General: There is no distension.     Palpations: Abdomen is soft.     Tenderness: There is no abdominal tenderness. There is no right CVA tenderness, left CVA tenderness or guarding.  Musculoskeletal:        General: No swelling, tenderness, deformity or signs of injury.     Right lower leg: No  edema.     Left lower leg: No edema.  Skin:    General: Skin is warm and dry.     Capillary Refill: Capillary refill takes less than 2 seconds.     Coloration: Skin is not jaundiced or pale.     Findings: No bruising, erythema or lesion.  Neurological:     Mental Status: He is alert and oriented to person, place, and time.     Motor: No weakness.     Gait: Gait normal.  Psychiatric:        Mood and Affect: Mood normal.        Behavior: Behavior normal.        Thought Content: Thought content normal.        Judgment: Judgment normal.     No results found for any visits on 01/04/24.      Assessment & Plan:   Problem List Items Addressed This Visit       Respiratory   Acute pharyngitis - Primary    Negative strep test, with symptoms and exam findings will treat with antibiotics - Prescribed amoxicillin  500 mg twice daily for 10 days she - Advised salt water gargles and warm water for pain relief. - Recommended Tylenol 250 mg every six hours for pain.      Relevant Medications   amoxicillin  (AMOXIL ) 500 MG capsule   Other Relevant Orders   POCT rapid strep A    Meds ordered this encounter  Medications   amoxicillin  (AMOXIL ) 500 MG capsule    Sig: Take 1 capsule (500 mg total) by mouth 2 (two) times daily for 10 days.    Dispense:  20 capsule    Refill:  0    No follow-ups on file.  Rox Mcgriff R Bailyn Spackman, FNP

## 2024-05-30 ENCOUNTER — Ambulatory Visit: Payer: Self-pay | Admitting: Nurse Practitioner

## 2024-08-30 ENCOUNTER — Encounter: Payer: Self-pay | Admitting: Nurse Practitioner
